# Patient Record
Sex: Female | Born: 1935 | Race: White | Hispanic: No
Health system: Southern US, Community
[De-identification: ages and names within clinical notes are randomized; demographics above are authoritative.]

---

## 2004-02-17 ENCOUNTER — Ambulatory Visit: Payer: Self-pay | Admitting: Internal Medicine

## 2004-02-22 ENCOUNTER — Ambulatory Visit: Payer: Self-pay | Admitting: Internal Medicine

## 2004-04-10 ENCOUNTER — Ambulatory Visit: Payer: Self-pay | Admitting: Internal Medicine

## 2005-03-13 ENCOUNTER — Emergency Department: Payer: Self-pay | Admitting: Unknown Physician Specialty

## 2005-04-19 ENCOUNTER — Ambulatory Visit: Payer: Self-pay | Admitting: Internal Medicine

## 2006-04-22 ENCOUNTER — Ambulatory Visit: Payer: Self-pay | Admitting: Internal Medicine

## 2007-05-27 ENCOUNTER — Ambulatory Visit: Payer: Self-pay | Admitting: Internal Medicine

## 2008-01-08 ENCOUNTER — Ambulatory Visit: Payer: Self-pay | Admitting: Ophthalmology

## 2008-01-20 ENCOUNTER — Ambulatory Visit: Payer: Self-pay | Admitting: Internal Medicine

## 2008-05-12 ENCOUNTER — Ambulatory Visit: Payer: Self-pay | Admitting: Internal Medicine

## 2010-10-30 ENCOUNTER — Ambulatory Visit: Payer: Self-pay | Admitting: Ophthalmology

## 2011-04-29 ENCOUNTER — Ambulatory Visit: Payer: Self-pay | Admitting: Oncology

## 2011-05-07 ENCOUNTER — Ambulatory Visit: Payer: Self-pay | Admitting: Internal Medicine

## 2011-05-15 ENCOUNTER — Ambulatory Visit: Payer: Self-pay | Admitting: Oncology

## 2011-05-15 LAB — COMPREHENSIVE METABOLIC PANEL
Albumin: 3.5 g/dL (ref 3.4–5.0)
Alkaline Phosphatase: 85 U/L (ref 50–136)
Anion Gap: 9 (ref 7–16)
Co2: 30 mmol/L (ref 21–32)
Creatinine: 1 mg/dL (ref 0.60–1.30)
EGFR (African American): 59 — ABNORMAL LOW
Osmolality: 284 (ref 275–301)
Potassium: 3.4 mmol/L — ABNORMAL LOW (ref 3.5–5.1)
Sodium: 141 mmol/L (ref 136–145)

## 2011-05-15 LAB — PROTIME-INR
INR: 1
Prothrombin Time: 13.1 secs (ref 11.5–14.7)

## 2011-05-15 LAB — CBC CANCER CENTER
Basophil %: 0.8 %
Eosinophil %: 0.5 %
MCHC: 33.4 g/dL (ref 32.0–36.0)
Monocyte #: 0.5 x10 3/mm (ref 0.2–0.9)
Monocyte %: 4.7 %
Neutrophil #: 7.8 x10 3/mm — ABNORMAL HIGH (ref 1.4–6.5)
Neutrophil %: 75.7 %
RBC: 4.63 10*6/uL (ref 3.80–5.20)
WBC: 10.2 x10 3/mm (ref 3.6–11.0)

## 2011-05-28 LAB — APTT: Activated PTT: 26.4 secs (ref 23.6–35.9)

## 2011-05-29 ENCOUNTER — Ambulatory Visit: Payer: Self-pay | Admitting: Oncology

## 2011-05-29 LAB — PATHOLOGY REPORT

## 2011-05-30 ENCOUNTER — Inpatient Hospital Stay: Payer: Self-pay | Admitting: Hematology & Oncology

## 2011-05-31 LAB — BASIC METABOLIC PANEL
Anion Gap: 5 — ABNORMAL LOW (ref 7–16)
BUN: 15 mg/dL (ref 7–18)
Chloride: 106 mmol/L (ref 98–107)
Co2: 30 mmol/L (ref 21–32)
EGFR (African American): >60
EGFR (Non-African Amer.): >60
Glucose: 159 mg/dL — ABNORMAL HIGH (ref 65–99)
Osmolality: 285 (ref 275–301)

## 2011-06-04 ENCOUNTER — Ambulatory Visit: Payer: Self-pay | Admitting: Oncology

## 2011-06-10 ENCOUNTER — Ambulatory Visit: Payer: Self-pay | Admitting: Oncology

## 2011-06-29 ENCOUNTER — Ambulatory Visit: Payer: Self-pay | Admitting: Oncology

## 2011-08-13 ENCOUNTER — Ambulatory Visit: Payer: Self-pay | Admitting: Oncology

## 2011-08-13 LAB — CBC CANCER CENTER
Basophil #: 0.1 x10 3/mm (ref 0.0–0.1)
Eosinophil #: 0.1 x10 3/mm (ref 0.0–0.7)
Eosinophil %: 0.6 %
Lymphocyte #: 1.3 x10 3/mm (ref 1.0–3.6)
Lymphocyte %: 14.5 %
MCH: 30.7 pg (ref 26.0–34.0)
MCV: 92 fL (ref 80–100)
Monocyte #: 0.6 x10 3/mm (ref 0.2–0.9)
Neutrophil %: 77.9 %
Platelet: 222 x10 3/mm (ref 150–440)
RDW: 13.7 % (ref 11.5–14.5)

## 2011-08-13 LAB — COMPREHENSIVE METABOLIC PANEL
Bilirubin,Total: 0.5 mg/dL (ref 0.2–1.0)
Calcium, Total: 8.5 mg/dL (ref 8.5–10.1)
Chloride: 101 mmol/L (ref 98–107)
Co2: 30 mmol/L (ref 21–32)
EGFR (Non-African Amer.): 55 — ABNORMAL LOW
Osmolality: 282 (ref 275–301)
Potassium: 3.1 mmol/L — ABNORMAL LOW (ref 3.5–5.1)
SGOT(AST): 16 U/L (ref 15–37)
Sodium: 140 mmol/L (ref 136–145)
Total Protein: 6.8 g/dL (ref 6.4–8.2)

## 2011-08-29 ENCOUNTER — Ambulatory Visit: Payer: Self-pay | Admitting: Oncology

## 2011-12-05 ENCOUNTER — Ambulatory Visit: Payer: Self-pay | Admitting: Oncology

## 2011-12-05 LAB — COMPREHENSIVE METABOLIC PANEL
Albumin: 3.3 g/dL — ABNORMAL LOW (ref 3.4–5.0)
Alkaline Phosphatase: 69 U/L (ref 50–136)
BUN: 16 mg/dL (ref 7–18)
Bilirubin,Total: 0.5 mg/dL (ref 0.2–1.0)
Calcium, Total: 8.5 mg/dL (ref 8.5–10.1)
Co2: 29 mmol/L (ref 21–32)
Creatinine: 0.89 mg/dL (ref 0.60–1.30)
EGFR (Non-African Amer.): 59 — ABNORMAL LOW
Osmolality: 286 (ref 275–301)
Potassium: 3.4 mmol/L — ABNORMAL LOW (ref 3.5–5.1)
Sodium: 142 mmol/L (ref 136–145)
Total Protein: 6.8 g/dL (ref 6.4–8.2)

## 2011-12-05 LAB — CBC CANCER CENTER
Basophil #: 0.1 x10 3/mm (ref 0.0–0.1)
Eosinophil #: 0.1 x10 3/mm (ref 0.0–0.7)
MCH: 30.3 pg (ref 26.0–34.0)
MCHC: 32.7 g/dL (ref 32.0–36.0)
Monocyte #: 0.7 x10 3/mm (ref 0.2–0.9)
Neutrophil %: 81.1 %
Platelet: 213 x10 3/mm (ref 150–440)
RDW: 14.7 % — ABNORMAL HIGH (ref 11.5–14.5)

## 2011-12-29 ENCOUNTER — Ambulatory Visit: Payer: Self-pay | Admitting: Oncology

## 2012-01-01 ENCOUNTER — Ambulatory Visit: Payer: Self-pay | Admitting: Oncology

## 2012-01-14 LAB — CBC CANCER CENTER
Basophil %: 0.7 %
Eosinophil #: 0.2 x10 3/mm (ref 0.0–0.7)
HGB: 12.7 g/dL (ref 12.0–16.0)
Lymphocyte %: 11.1 %
MCH: 30.3 pg (ref 26.0–34.0)
MCHC: 34 g/dL (ref 32.0–36.0)
Monocyte #: 0.7 x10 3/mm (ref 0.2–0.9)
Neutrophil #: 10.1 x10 3/mm — ABNORMAL HIGH (ref 1.4–6.5)
Neutrophil %: 81.1 %
Platelet: 254 x10 3/mm (ref 150–440)
WBC: 12.4 x10 3/mm — ABNORMAL HIGH (ref 3.6–11.0)

## 2012-01-25 ENCOUNTER — Emergency Department: Payer: Self-pay | Admitting: Emergency Medicine

## 2012-01-25 LAB — COMPREHENSIVE METABOLIC PANEL
Alkaline Phosphatase: 73 U/L (ref 50–136)
Anion Gap: 8 (ref 7–16)
BUN: 15 mg/dL (ref 7–18)
Bilirubin,Total: 0.4 mg/dL (ref 0.2–1.0)
Calcium, Total: 8.2 mg/dL — ABNORMAL LOW (ref 8.5–10.1)
Chloride: 105 mmol/L (ref 98–107)
Co2: 25 mmol/L (ref 21–32)
EGFR (African American): >60
Glucose: 87 mg/dL (ref 65–99)
Potassium: 3.7 mmol/L (ref 3.5–5.1)
SGOT(AST): 18 U/L (ref 15–37)
Sodium: 138 mmol/L (ref 136–145)
Total Protein: 6.9 g/dL (ref 6.4–8.2)

## 2012-01-25 LAB — DRUG SCREEN, URINE
Amphetamines, Ur Screen: NEGATIVE (ref ?–1000)
Barbiturates, Ur Screen: NEGATIVE (ref ?–200)
Benzodiazepine, Ur Scrn: POSITIVE (ref ?–200)
Cannabinoid 50 Ng, Ur ~~LOC~~: NEGATIVE (ref ?–50)
Methadone, Ur Screen: NEGATIVE (ref ?–300)
Opiate, Ur Screen: NEGATIVE (ref ?–300)
Phencyclidine (PCP) Ur S: NEGATIVE (ref ?–25)

## 2012-01-25 LAB — URINALYSIS, COMPLETE
Bilirubin,UR: NEGATIVE
Glucose,UR: NEGATIVE mg/dL (ref 0–75)
Hyaline Cast: 3
Ketone: NEGATIVE
RBC,UR: 8 /HPF (ref 0–5)
Squamous Epithelial: NONE SEEN
WBC UR: 11 /HPF (ref 0–5)

## 2012-01-25 LAB — CBC
HCT: 35.2 % (ref 35.0–47.0)
HGB: 12.1 g/dL (ref 12.0–16.0)
MCH: 30.5 pg (ref 26.0–34.0)
MCHC: 34.4 g/dL (ref 32.0–36.0)
RBC: 3.97 10*6/uL (ref 3.80–5.20)
WBC: 8.6 10*3/uL (ref 3.6–11.0)

## 2012-01-25 LAB — TROPONIN I: Troponin-I: <0.02 ng/mL

## 2012-01-29 ENCOUNTER — Ambulatory Visit: Payer: Self-pay | Admitting: Oncology

## 2012-02-07 ENCOUNTER — Ambulatory Visit: Payer: Self-pay | Admitting: Oncology

## 2012-02-07 LAB — COMPREHENSIVE METABOLIC PANEL
Alkaline Phosphatase: 76 U/L (ref 50–136)
Anion Gap: 5 — ABNORMAL LOW (ref 7–16)
Bilirubin,Total: 0.6 mg/dL (ref 0.2–1.0)
EGFR (African American): >60
Osmolality: 279 (ref 275–301)
Potassium: 4 mmol/L (ref 3.5–5.1)
Sodium: 137 mmol/L (ref 136–145)
Total Protein: 6.8 g/dL (ref 6.4–8.2)

## 2012-02-07 LAB — CBC CANCER CENTER
Basophil %: 0.8 %
Eosinophil %: 1.2 %
HCT: 38.1 % (ref 35.0–47.0)
HGB: 13.1 g/dL (ref 12.0–16.0)
Lymphocyte #: 1.7 x10 3/mm (ref 1.0–3.6)
Monocyte #: 1.2 x10 3/mm — ABNORMAL HIGH (ref 0.2–0.9)
Monocyte %: 7.5 %
Neutrophil #: 12.6 x10 3/mm — ABNORMAL HIGH (ref 1.4–6.5)
Platelet: 192 x10 3/mm (ref 150–440)
WBC: 15.8 x10 3/mm — ABNORMAL HIGH (ref 3.6–11.0)

## 2012-02-29 ENCOUNTER — Ambulatory Visit: Payer: Self-pay | Admitting: Oncology

## 2012-03-28 DEATH — deceased

## 2013-01-25 IMAGING — CT NM PET TUM IMG INITIAL (PI) SKULL BASE T - THIGH
1 of 5 series · 1 of 25 positions shown · non-contrast
Comparison: none

REASON FOR EXAM: lung cancer staging work up
COMMENTS:

PROCEDURE:     PET - PET/CT INIT STAGING LUNG CA  - June 10, 2011 [DATE]
RESULT:     Indication: Lung cancer
Radiopharmaceutical: Z3.9Nm1i F18-FDG, intravenously.
TECHNIQUE: Imaging was performed from the skull base to the mid-thigh using
routine PET/CT acquisition protocol.
Injection site: Left antecubital
Time of FDG injection: 8883 hours
Serum glucose: 96 mg/dL
Time of imaging: 8287 hours
Comparison studies: NONE

[Series 3: ct wb 3.0 b30f · axial · 3.0mm · 0.98mm/px · 1 of 435 slices shown]
[im 435/435  brain]
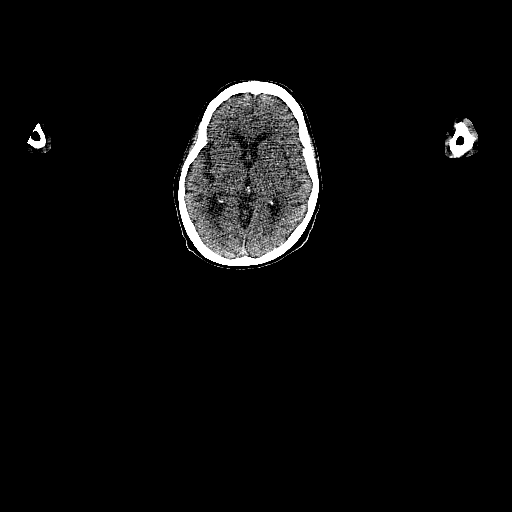

[1 of 25 positions shown; findings below may reference images not displayed]

FINDINGS: HEAD AND NECK:

There is no abnormal hypermetabolic activity in the head and neck. There is
no cervical soft tissue mass or lymphandenopathy.

CHEST:

There is a 3.5 cm spiculated hypermetabolic right upper lobe pulmonary mass
with an SUV max of 12.4 and an SUV average of 8.3 . There is a
hypermetabolic right hilar lymph nodes with an SUV max of 5.8 and an SUV
average of 3.4 . There is a hypermetabolic right lower lobe mass measuring
4.2 cm  an SUV max of 6.5 and an SUV average of 3.8 . There is a
hypermetabolic left lower lobe pulmonary nodule. There is hypermetabolic
left hilar lymphadenopathy.

There is a focal area of hypermetabolic activity within the left
infraspinatus muscle.

The heart size is normal. There is no pericardial effusion. There is
coronary artery atherosclerosis.

The osseous structures demonstrate no focal abnormality.

ABDOMEN/PELVIS:

The liver demonstrates no focal abnormality. The gallbladder is surgically
absent. The spleen demonstrates no focal abnormality. There is a right renal
cyst. The left kidney , adrenal glands, pancreas are normal. The bladder is
unremarkable.

The unopacified bowel is unremarkable. There is no pneumoperitoneum,
pneumatosis, or portal venous gas. There is no abdominal or pelvic free
fluid. There is no lymphadenopathy.

The abdominal aorta is normal in caliber.

The osseous structures are unremarkable.
IMPRESSION: 1. There is a hypermetabolic mass in the right upper lobe and right lower
lobe consistent with malignancy. There is a hypermetabolic left lower lobe
pulmonary nodule. There is bilateral hilar hypermetabolic lymphadenopathy
concerning for nodal metastasis.

2. There is focal hypermetabolic activity within the left infraspinatus
muscle which may reflect sequela of direct trauma versus muscle strain.

[REDACTED]

## 2014-05-22 NOTE — Consult Note (Signed)
Patient with PTX after biopsy.  Has enlarged over last 24 hours, and is significant.  Asked to place a chest tube under fluoro, and will be happy to do so this afternoon  Electronic Signatures: Annice Needyew, Jason S (MD)  (Signed on 01-May-13 17:03)  Authored  Last Updated: 01-May-13 17:03 by Annice Needyew, Jason S (MD)

## 2014-05-22 NOTE — Consult Note (Signed)
History of Present Illness:   Reason for Consult Referred by Dr. Eliott Nine for lung nodules on both lungs  CT scan of the chest (May 07, 2011 is abnormal.  There is 3.5 cm spiculated right upper lobe pulmonary mass.  Significantly enlarged from the previous CT scan of May 12 2008.  There is a left lower lobe mass.  2.2 cm nodule abutting the pericardium    HPI   81 year old lady was a chronic smoker has lost approximately 15 pounds of weight the last few months which she believes secondary to cataract surgery.  Had a repeat chest x-ray followed by CT scan which was abnormal as described above.  Patient has some right-sided chest pain.  No cough.  No hemoptysis.  Was referred to me for further evaluation and treatment consideration  PFSH:   Comments Family history of breast cancer.    Social History negative alcohol, positive tobacco    Comments Patient smokes 2 pack per day for last 50 years or more    Additional Past Medical and Surgical History Cervical cancer in 1959 Hysterectomy  Hypertension  Previous history of TIA   Review of Systems:   General denies complaints  weight loss    Performance Status (ECOG) 0    HEENT no complaints    Lungs no complaints    Cardiac no complaints    GI no complaints    GU no complaints    Musculoskeletal no complaints    Extremities no complaints    Skin no complaints    Neuro no complaints    Endocrine no complaints    Psych no complaints    Review of Systems   as per history of present illness  NURSING NOTES: *CC Vital Signs Flowsheet:   17-Apr-13 13:45    Temp Temperature: 98    Pulse Pulse: 69    Respirations Respirations: 20    SBP SBP: 183    DBP DBP: 95    Pain Scale (0-10): 0    Pulse Oxi: 99    Current Weight (kg) (kg): 51    Height (cm) centimeters: 156    BSA (m2): 1.4   Physical Exam:   General 15 pound weight loss.   Patient is alert and oriented not in any acute distress    HEENT: normal     Lungs: rhonchi  bilateral    Cardiac: regular rate, rhythm    Breast: Bilateral normal    Abdomen: soft  nontender  positive bowel sounds  nondistended    Skin: intact    Extremities: No edema, rash or cyanosis    Neuro: AAOx3  cranial nerves intact    Psych: normal appearance  alert and cooperative  intermittent confusion       COPD:    Dementia:    Cervical Cancer: 1959   CVA/Stroke:    HTN:    Cholecystectomy: 2008   Hysterectomy - Total:    PCN: Rash  Sulfa drugs: Rash      losartan 100 mg oral tablet: 1 tab(s) orally once a day, Active, 0, None   isosorbide mononitrate 30 mg oral tablet, extended release: 1 tab(s) orally once a day (in the morning), Active, 0, None   furosemide 20 mg oral tablet: 1 tab(s) orally once a day, Active, 0, None   amlodipine 10 mg oral tablet: 1 tab(s) orally once a day, Active, 0, None   Ventolin HFA:  orally , As Needed, Active, 0, None   Mucinex 600 mg  oral tablet, extended release: 1 tab(s) orally every 12 hours, Active, 0, None    Routine Hem:  17-Apr-13 14:36    WBC (CBC) 10.2   RBC (CBC) 4.63   Hemoglobin (CBC) 14.2   Hematocrit (CBC) 42.7   Platelet Count (CBC) 205   MCV 92   MCH 30.8   MCHC 33.4   RDW 14.1   Neutrophil % 75.7   Lymphocyte % 18.3   Monocyte % 4.7   Eosinophil % 0.5   Neutrophil # 7.8   Lymphocyte # 1.9   Monocyte # 0.5   Eosinophil # 0.1   Basophil # 0.1  Routine Chem:  17-Apr-13 14:36    Glucose, Serum 99   BUN 21   Creatinine (comp) 1.00   Sodium, Serum 141   Potassium, Serum 3.4   Chloride, Serum 102   CO2, Serum 30   Calcium (Total), Serum 8.5  Hepatic:  17-Apr-13 14:36    Bilirubin, Total 0.4   Alkaline Phosphatase 85   SGPT (ALT) 18   SGOT (AST) 20   Total Protein, Serum 7.2   Albumin, Serum 3.5  Routine Chem:  17-Apr-13 14:36    Osmolality (calc) 284   eGFR (African American) 59   eGFR (Non-African American) 51   Anion Gap 9  Routine Coag:  17-Apr-13  14:36    Prothrombin 13.1   INR 1.0       09-Apr-13 14:38, CT Chest With Contrast   Assessment and Plan:  Impression:   1.abnormal CT scan of the chest which is independently reviewed and compared with the previous CT scan.  There is definite growth of pulmonary mass in the right upper lobe.  There is also another nodule in the right lower lobe it has also gone from the previous CT scan (2010.  Patient is a chronic smoker suggesting possibility of lung cancer or a metastatic disease.  This was accompanied with the family We had a prolonged discussion with patient and family regarding the need for biopsy.  We discussed this case in tumor conference.  Possibility of treatment options if this turns out to be lung cancer versus metastatic disease from another primary.  All those options were discussed at length.  Complications of biopsy including but not limited to pneumothorax has been discussed.discussed case in tumor conference tomorrow.  Electronic Signatures: Singleton Hickox, Martie Lee (MD)  (Signed 17-Apr-13 16:41)  Authored: HISTORY OF PRESENT ILLNESS, PFSH, ROS, NURSING NOTES, PE, PAST MEDICAL HISTORY, ALLERGIES, HOME MEDICATIONS, LABS, OTHER RESULTS, ASSESSMENT AND PLAN   Last Updated: 17-Apr-13 16:41 by Jobe Gibbon (MD)

## 2014-05-22 NOTE — Consult Note (Signed)
PATIENT NAME:  Heidi [name], [name] MR#:  [id number] DATE OF BIRTH:  04-06-1924  DATE OF CONSULTATION:  05/29/2011  CONSULTING PHYSICIAN:  Annice NeedyJason S. Dew, MD  REASON FOR CONSULTATION: Pneumothorax, chest tube placement.   HISTORY OF PRESENT ILLNESS: The patient is an 81 year old female who had a lung biopsy the day before. She had an approximately 30% pneumothorax following her lung biopsy. She was admitted for observation for a recent diagnosis of lung cancer, pneumothorax and shortness of breath. Over the last 24 hours the pneumothorax has enlarged. She has become more irritable and short of breath. She has known dementia, and history is obtained from the previous medical records and her family largely. She is also having worsening chest pain of the right side, and with the enlargement of the pneumothorax which is symptomatic in a patient with severe lung disease, we are asked to place a chest tube.   PAST MEDICAL HISTORY: Significant for: 1. Chronic obstructive pulmonary disease.  2. History of stroke.  3. Dementia.  4. Recently diagnosed lung cancer.  5. Hypertension.   HOME MEDICATIONS:  1. Losartan 100 mg daily.  2. Imdur 30 mg daily.  3. Lasix 20 mg daily.  4. Amlodipine 10 mg daily.  5. Ventolin inhaler as needed.  6. Mucinex every 12 hours.   ALLERGIES: Penicillin and sulfa.   SOCIAL HISTORY: Long history of tobacco dependence, debatable whether or not she has recently stopped. She is accompanied by family, who are supportive. No alcohol dependence.   FAMILY HISTORY: Noncontributory to present illness.   REVIEW OF SYSTEMS: Review of systems is really difficult to obtain due to her dementia and not reliably answering questions. She has become a bit more irritated and short of breath since the biopsy with the pneumothorax. Otherwise, there are no new complaints.   PHYSICAL EXAMINATION:  GENERAL: This is an elderly white female who was a bit irritable lying in the bed but not in  acute distress.   VITAL SIGNS: Temperature 98.2, pulse 80, blood pressure 164/95. Saturations are 91% on 3 liters nasal cannula.   HEENT: Head: Normocephalic and atraumatic. Eyes: Sclerae are anicteric. Conjunctivae are clear. Ears: Normal external appearance. Hearing is intact.   NECK: Neck is supple without adenopathy or jugular venous distention.   HEART: Regular rate and rhythm.   LUNGS: Diminished on the right with expiratory wheezes bilaterally.   ABDOMEN: Soft, nondistended, nontender.   EXTREMITIES: Warm and well perfused, good capillary refill.   NEUROLOGICAL: Normal strength and tone in all four extremities.   PSYCHIATRIC: Confused, normal affect.   LABORATORY, DIAGNOSTIC AND RADIOLOGICAL DATA: No laboratory evaluations on this admission. Imaging studies with chest x-ray showing enlarging right pneumothorax after her biopsy.   ASSESSMENT AND PLAN: This is an 81 year old white female with pneumothorax after a lung biopsy with a recent diagnosis of lung cancer. She has symptoms including hypoxia, worsening oxygen requirement, agitation and shortness of breath; and with the enlargement of the pneumothorax I do think it is reasonable to place a small caliber chest tube. We have been asked to do so  under fluoroscopic guidance with a light sedative given her combativeness, and I think this is very reasonable. This will be done this afternoon.   This is a level-3 consultation.    ____________________________ Annice NeedyJason S. Dew, MD jsd:cbb D: 06/03/2011 13:59:53 ET T: 06/03/2011 15:36:18 ET JOB#: [id number]  cc: Annice NeedyJason S. Dew, MD, <Dictator> Annice NeedyJASON S DEW MD ELECTRONICALLY SIGNED 06/08/2011 7:39

## 2014-05-22 NOTE — Op Note (Signed)
PATIENT NAME:  [name], Heidi [name] MR#:  [id number] DATE OF BIRTH:  05/01/1924  DATE OF PROCEDURE:  05/29/2011  PREOPERATIVE DIAGNOSES:  1. Enlarging pneumothorax.  2. Right lung mass status post biopsy.  3. Dementia.  4. Chronic obstructive pulmonary disease.  5. Hypertension.   POSTOPERATIVE DIAGNOSES:    1. Enlarging pneumothorax.  2. Right lung mass status post biopsy.  3. Dementia.  4. Chronic obstructive pulmonary disease.  5. Hypertension.   PROCEDURE: Right-sided chest tube placement with fluoroscopic guidance.   SURGEON: Delesia Martinek S. Tarhonda Hollenberg, M.D.   ANESTHESIA: Local with 2 mg of Versed.   ESTIMATED BLOOD LOSS: Minimal.  FLUOROSCOPY TIME: Approximately two minutes.  CONTRAST USED: None.   INDICATION FOR PROCEDURE: 81-year-old white female who has a pneumothorax post biopsy. This has enlarged on subsequent x-rays and now needs to be corrected with tube thoracostomy. The risks and benefits are discussed. Informed consent was obtained.   DESCRIPTION OF PROCEDURE: The patient was brought to the vascular interventional radiology suite. She was given intravenous sedative. Her right chest was sterilely prepped and draped. An area was anesthetized in the right axilla and I used fluoroscopic guidance to enter the chest with a micropuncture needle. An 0.018 wire was then placed. Over the 0.018 wire I exchanged over a small dilator and sheath for an 0.035 wire. A #10 dilator was used to dilate the tract. The wire was placed into the apex and an #8 French chest tube was then placed over the wire and the wire was removed. This was placed in excellent location in the apex of the lung under fluoro guidance and secured with two silk sutures of the skin. Completion fluoroscopic x-ray showed near resolution of the pneumothorax with only a minimal residual pneumothorax remaining. The patient tolerated the procedure well and was taken to the recovery room in stable condition.    ____________________________ Bernell Haynie S. Adalie Mand, MD jsd:ap D: 05/29/2011 17:34:00 ET T: 05/30/2011 10:29:11 ET JOB#: [id number]  cc: Shelbie Franken S. Bonnita Newby, MD, <Dictator> Levent Kornegay S Alilah Mcmeans MD ELECTRONICALLY SIGNED 05/30/2011 13:57 

## 2014-05-22 NOTE — Op Note (Signed)
PATIENT NAME:  Heidi KnifeWHITTEMORE, Chelise G MR#:  [id number] DATE OF BIRTH:  07/21/1924  DATE OF PROCEDURE:  05/29/2011  PREOPERATIVE DIAGNOSES:  1. Enlarging pneumothorax.  2. Right lung mass status post biopsy.  3. Dementia.  4. Chronic obstructive pulmonary disease.  5. Hypertension.   POSTOPERATIVE DIAGNOSES:    1. Enlarging pneumothorax.  2. Right lung mass status post biopsy.  3. Dementia.  4. Chronic obstructive pulmonary disease.  5. Hypertension.   PROCEDURE: Right-sided chest tube placement with fluoroscopic guidance.   SURGEON: Annice NeedyJason S. Jhan Conery, M.D.   ANESTHESIA: Local with 2 mg of Versed.   ESTIMATED BLOOD LOSS: Minimal.  FLUOROSCOPY TIME: Approximately two minutes.  CONTRAST USED: None.   INDICATION FOR PROCEDURE: 81 year old white female who has a pneumothorax post biopsy. This has enlarged on subsequent x-rays and now needs to be corrected with tube thoracostomy. The risks and benefits are discussed. Informed consent was obtained.   DESCRIPTION OF PROCEDURE: The patient was brought to the vascular interventional radiology suite. She was given intravenous sedative. Her right chest was sterilely prepped and draped. An area was anesthetized in the right axilla and I used fluoroscopic guidance to enter the chest with a micropuncture needle. An 0.018 wire was then placed. Over the 0.018 wire I exchanged over a small dilator and sheath for an 0.035 wire. A #10 dilator was used to dilate the tract. The wire was placed into the apex and an #8 French chest tube was then placed over the wire and the wire was removed. This was placed in excellent location in the apex of the lung under fluoro guidance and secured with two silk sutures of the skin. Completion fluoroscopic x-ray showed near resolution of the pneumothorax with only a minimal residual pneumothorax remaining. The patient tolerated the procedure well and was taken to the recovery room in stable condition.    ____________________________ Annice NeedyJason S. Safa Derner, MD jsd:ap D: 05/29/2011 17:34:00 ET T: 05/30/2011 10:29:11 ET JOB#: [id number]  cc: Annice NeedyJason S. Elandra Powell, MD, <Dictator> Annice NeedyJASON S Sidnie Swalley MD ELECTRONICALLY SIGNED 05/30/2011 13:57
